# Patient Record
Sex: Female | Born: 1945 | Race: White | Hispanic: No | Marital: Married
Health system: Southern US, Community
[De-identification: ages and names within clinical notes are randomized; demographics above are authoritative.]

## PROBLEM LIST (undated history)

## (undated) DIAGNOSIS — I4891 Unspecified atrial fibrillation: Secondary | ICD-10-CM

## (undated) DIAGNOSIS — E119 Type 2 diabetes mellitus without complications: Secondary | ICD-10-CM

## (undated) DIAGNOSIS — I509 Heart failure, unspecified: Secondary | ICD-10-CM

## (undated) DIAGNOSIS — Z950 Presence of cardiac pacemaker: Secondary | ICD-10-CM

---

## 2018-06-17 ENCOUNTER — Encounter (HOSPITAL_COMMUNITY): Payer: Self-pay | Admitting: Emergency Medicine

## 2018-06-17 ENCOUNTER — Emergency Department (HOSPITAL_COMMUNITY): Payer: Medicare Other

## 2018-06-17 ENCOUNTER — Emergency Department (HOSPITAL_COMMUNITY)
Admission: EM | Admit: 2018-06-17 | Discharge: 2018-06-17 | Disposition: A | Discharge status: Home / Self Care | Payer: Medicare Other | Attending: Emergency Medicine | Admitting: Emergency Medicine

## 2018-06-17 DIAGNOSIS — Y9389 Activity, other specified: Secondary | ICD-10-CM | POA: Diagnosis not present

## 2018-06-17 DIAGNOSIS — S51012A Laceration without foreign body of left elbow, initial encounter: Secondary | ICD-10-CM | POA: Diagnosis not present

## 2018-06-17 DIAGNOSIS — Z950 Presence of cardiac pacemaker: Secondary | ICD-10-CM | POA: Insufficient documentation

## 2018-06-17 DIAGNOSIS — E119 Type 2 diabetes mellitus without complications: Secondary | ICD-10-CM | POA: Diagnosis not present

## 2018-06-17 DIAGNOSIS — W0110XA Fall on same level from slipping, tripping and stumbling with subsequent striking against unspecified object, initial encounter: Secondary | ICD-10-CM | POA: Insufficient documentation

## 2018-06-17 DIAGNOSIS — Y998 Other external cause status: Secondary | ICD-10-CM | POA: Insufficient documentation

## 2018-06-17 DIAGNOSIS — R55 Syncope and collapse: Secondary | ICD-10-CM | POA: Diagnosis not present

## 2018-06-17 DIAGNOSIS — I509 Heart failure, unspecified: Secondary | ICD-10-CM | POA: Insufficient documentation

## 2018-06-17 DIAGNOSIS — Y92520 Airport as the place of occurrence of the external cause: Secondary | ICD-10-CM | POA: Insufficient documentation

## 2018-06-17 DIAGNOSIS — S59902A Unspecified injury of left elbow, initial encounter: Secondary | ICD-10-CM | POA: Diagnosis present

## 2018-06-17 HISTORY — DX: Type 2 diabetes mellitus without complications: E11.9

## 2018-06-17 HISTORY — DX: Unspecified atrial fibrillation: I48.91

## 2018-06-17 HISTORY — DX: Presence of cardiac pacemaker: Z95.0

## 2018-06-17 HISTORY — DX: Heart failure, unspecified: I50.9

## 2018-06-17 LAB — CBC WITH DIFFERENTIAL/PLATELET
Abs Immature Granulocytes: 0.02 10*3/uL (ref 0.00–0.07)
Basophils Absolute: 0 10*3/uL (ref 0.0–0.1)
Basophils Relative: 0 %
EOS ABS: 0.3 10*3/uL (ref 0.0–0.5)
EOS PCT: 6 %
HEMATOCRIT: 40.6 % (ref 36.0–46.0)
HEMOGLOBIN: 12.5 g/dL (ref 12.0–15.0)
Immature Granulocytes: 0 %
Lymphocytes Relative: 14 %
Lymphs Abs: 0.7 10*3/uL (ref 0.7–4.0)
MCH: 30.8 pg (ref 26.0–34.0)
MCHC: 30.8 g/dL (ref 30.0–36.0)
MCV: 100 fL (ref 80.0–100.0)
MONO ABS: 0.4 10*3/uL (ref 0.1–1.0)
MONOS PCT: 9 %
NRBC: 0 % (ref 0.0–0.2)
Neutro Abs: 3.7 10*3/uL (ref 1.7–7.7)
Neutrophils Relative %: 71 %
Platelets: 91 10*3/uL — ABNORMAL LOW (ref 150–400)
RBC: 4.06 MIL/uL (ref 3.87–5.11)
RDW: 15.9 % — ABNORMAL HIGH (ref 11.5–15.5)
WBC: 5.2 10*3/uL (ref 4.0–10.5)

## 2018-06-17 LAB — BASIC METABOLIC PANEL
ANION GAP: 11 (ref 5–15)
BUN: 64 mg/dL — AB (ref 8–23)
CHLORIDE: 108 mmol/L (ref 98–111)
CO2: 21 mmol/L — ABNORMAL LOW (ref 22–32)
Calcium: 9.1 mg/dL (ref 8.9–10.3)
Creatinine, Ser: 2.84 mg/dL — ABNORMAL HIGH (ref 0.44–1.00)
GFR calc Af Amer: 18 mL/min — ABNORMAL LOW (ref >60)
GFR calc non Af Amer: 16 mL/min — ABNORMAL LOW (ref >60)
GLUCOSE: 135 mg/dL — AB (ref 70–99)
POTASSIUM: 4.7 mmol/L (ref 3.5–5.1)
Sodium: 140 mmol/L (ref 135–145)

## 2018-06-17 LAB — BRAIN NATRIURETIC PEPTIDE: B NATRIURETIC PEPTIDE 5: 3246.2 pg/mL — AB (ref 0.0–100.0)

## 2018-06-17 LAB — CBG MONITORING, ED: Glucose-Capillary: 122 mg/dL — ABNORMAL HIGH (ref 70–99)

## 2018-06-17 LAB — PROTIME-INR
INR: 3.53
PROTHROMBIN TIME: 34.9 s — AB (ref 11.4–15.2)

## 2018-06-17 LAB — MAGNESIUM: Magnesium: 2.2 mg/dL (ref 1.7–2.4)

## 2018-06-17 LAB — TROPONIN I: Troponin I: <0.03 ng/mL (ref <0.03)

## 2018-06-17 NOTE — ED Notes (Signed)
Patient Alert and oriented to baseline. Stable and ambulatory to baseline. Patient verbalized understanding of the discharge instructions.  Patient belongings were taken by the patient.   

## 2018-06-17 NOTE — ED Notes (Signed)
Pacemaker interrogated. 

## 2018-06-17 NOTE — ED Provider Notes (Addendum)
[facility]   CSN: [id number] Arrival date & time: 06/17/18  1537     History   Chief Complaint Chief Complaint  Patient presents with  . Loss of Consciousness    HPI Alfonzo BeersMary Dell Gunnarson is a 72 y.o. female.  HPI Patient presented to the emergency room for evaluation of a syncopal episode.  Patient was traveling to be with her family here in [place].  She was getting off the plane she when she started to feel hot and lightheaded.  Patient felt like she might pass out.  She mentioned this to her husband who suggested she go sit down.  Patient managed to get to the seats but apparently she then had a syncopal episode.  She apparently slumped over on the seat and was helped down to the ground.  Patient was unresponsive for few minutes.  According to the EMS report bystanders CPR was even initiated.  Patient regained consciousness and now she denies any complaints whatsoever.  She is not having any trouble with any chest pain or shortness of breath.  No nausea vomiting or diarrhea.  She has not noticed any blood in her stool.  She does have a history of medical problems including atrial fibrillation and she also has an AICD. Past Medical History:  Diagnosis Date  . Atrial fibrillation (HCC)   . CHF (congestive heart failure) (HCC)   . Diabetes mellitus without complication (HCC)   . Pacemaker     There are no active problems to display for this patient.   History reviewed. No pertinent surgical history.   OB History   None      Home Medications    Prior to Admission medications   Not on File    Family History No family history on file.  Social History Social History   Tobacco Use  . Smoking status: Not on file  Substance Use Topics  . Alcohol use: Not on file  . Drug use: Not on file     Allergies   Patient has no allergy information on record.   Review of Systems Review of Systems    All other systems reviewed and are negative.    Physical Exam Updated Vital Signs BP 131/77   Pulse 69   Temp 97.8 F (36.6 C) (Oral)   Resp 15   SpO2 98%   Physical Exam  Constitutional: No distress.  HENT:  Head: Normocephalic and atraumatic.  Right Ear: External ear normal.  Left Ear: External ear normal.  Mouth/Throat: No oropharyngeal exudate.  Eyes: Conjunctivae are normal. Right eye exhibits no discharge. Left eye exhibits no discharge. No scleral icterus.  Neck: Neck supple. No tracheal deviation present.  Cardiovascular: Normal rate, regular rhythm and intact distal pulses.  Pulmonary/Chest: Effort normal and breath sounds normal. No stridor. No respiratory distress. She has no wheezes. She has no rales.  Abdominal: Soft. Bowel sounds are normal. She exhibits no distension. There is no tenderness. There is no rebound and no guarding.  Musculoskeletal: She exhibits edema. She exhibits no tenderness.  Trace bilateral; skin tear left upper extremity  Neurological: She is alert. She has normal strength. No cranial nerve deficit (no facial droop, extraocular movements intact, no slurred speech) or sensory deficit. She exhibits normal muscle tone. She displays no seizure activity. Coordination normal.  No pronator drift, normal speech, extraocular movements intact  Skin: Skin is warm and dry. No rash noted. She is not diaphoretic.  Psychiatric:  She has a normal mood and affect.  Nursing note and vitals reviewed.    ED Treatments / Results  Labs (all labs ordered are listed, but only abnormal results are displayed) Labs Reviewed  CBC WITH DIFFERENTIAL/PLATELET - Abnormal; Notable for the following components:      Result Value   RDW 15.9 (*)    Platelets 91 (*)    All other components within normal limits  BASIC METABOLIC PANEL - Abnormal; Notable for the following components:   CO2 21 (*)    Glucose, Bld 135 (*)    BUN 64 (*)    Creatinine, Ser 2.84 (*)    GFR  calc non Af Amer 16 (*)    GFR calc Af Amer 18 (*)    All other components within normal limits  PROTIME-INR - Abnormal; Notable for the following components:   Prothrombin Time 34.9 (*)    All other components within normal limits  BRAIN NATRIURETIC PEPTIDE - Abnormal; Notable for the following components:   B Natriuretic Peptide 3,246.2 (*)    All other components within normal limits  CBG MONITORING, ED - Abnormal; Notable for the following components:   Glucose-Capillary 122 (*)    All other components within normal limits  TROPONIN I  MAGNESIUM    EKG EKG Interpretation  Date/Time:  Friday June 17 2018 15:43:30 EST Ventricular Rate:  78 PR Interval:    QRS Duration: 153 QT Interval:  457 QTC Calculation: 521 R Axis:   -74 Text Interpretation:  Atrial fibrillation IVCD, consider atypical RBBB Inferior infarct, age indeterminate Probable anterior infarct, age indeterminate No old tracing to compare Confirmed by Linwood Dibbles ([id number]) on 06/17/2018 4:07:17 PM   Radiology Dg Chest 2 View  Result Date: 06/17/2018 CLINICAL DATA:  Increasing fatigue after flight. Weakness. History of atrial fibrillation and CHF. EXAM: CHEST - 2 VIEW COMPARISON:  None. FINDINGS: Cardiac silhouette is moderately enlarged. Calcified aortic arch. Status post median sternotomy for CABG. Pulmonary vasculature is normal. No pleural effusion or focal consolidation. Three lead LEFT AICD with lead tips projecting RIGHT atrium, bilateral ventricles. No pneumothorax. Osteopenia. IMPRESSION: 1. Cardiomegaly.  No acute pulmonary process. 2.  Aortic Atherosclerosis (ICD10-I70.0). Electronically Signed   By: Awilda Metro M.D.   On: 06/17/2018 17:28    Procedures .Marland KitchenLaceration Repair Date/Time: 06/17/2018 6:41 PM Performed by: Linwood Dibbles, MD Authorized by: Linwood Dibbles, MD   Consent:    Consent obtained:  Verbal   Consent given by:  Patient   Risks discussed:  Infection, need for additional repair, pain,  poor cosmetic result and poor wound healing   Alternatives discussed:  No treatment and delayed treatment Universal protocol:    Procedure explained and questions answered to patient or proxy's satisfaction: yes     Relevant documents present and verified: yes     Test results available and properly labeled: yes     Imaging studies available: yes     Required blood products, implants, devices, and special equipment available: yes     Site/side marked: yes     Immediately prior to procedure, a time out was called: yes     Patient identity confirmed:  Verbally with patient Anesthesia (see MAR for exact dosages):    Anesthesia method:  None Laceration details:    Location:  Shoulder/arm   Shoulder/arm location:  L lower arm   Length (cm):  3 Repair type:    Repair type:  Simple Treatment:    Area cleansed with:  Saline  Amount of cleaning:  Standard Skin repair:    Repair method:  Tissue adhesive Comments:     superifical skin tear tacked down with dermabond   (including critical care time)  Medications Ordered in ED Medications - No data to display   Initial Impression / Assessment and Plan / ED Course  I have reviewed the triage vital signs and the nursing notes.  Pertinent labs & imaging results that were available during my care of the patient were reviewed by me and considered in my medical decision making (see chart for details).  Clinical Course as of Jun 17 1841  Fri Jun 17, 2018  1607 Brief episode of wide complex tachycardia, approx 1.2 sec duration   [JK]  1705 BUN and CT elevated.  NO old for comparison.   [JK]  1706 INR therapeutic   [JK]  1707 Old records reviewed.  In 2018 , CR was 2.42 by care everywhere in [place]   [JK]  4696 Previous records reviewed from [facility].   [JK]  1828 Patient's AICD pacemaker was interrogated.  No acute events were noted   [JK]    Clinical Course User Index [JK] Linwood Dibbles, MD    She presented  to the emergency room for evaluation after a syncopal episode.  Patient was on a flight when she started to get hot and lightheaded walking out of the plane.  Pt feels fine now.  She has multiple medical problems including ishcemic cardiomyopathy  .  The patient pacemaker was interrogated and there were no acute events noted.  I recommended that the patient be admitted to the hospital for cardiac observation considering her history.  Patient states she feels fine and does not want to be admitted to the hospital.  I explained to her it is possible she could have had an acute cardiac event causing her syncopal episode.  Patient understands and states she will return if she has any recurrent symptoms  Final Clinical Impressions(s) / ED Diagnoses   Final diagnoses:  Syncope, unspecified syncope type  Skin tear of left elbow without complication, initial encounter    ED Discharge Orders    None       Linwood Dibbles, MD 06/17/18 Hermin[facility]    Linwood Dibbles, MD 06/17/18 1842 Laceration length addendum   Linwood Dibbles, MD 06/27/18 1654

## 2018-06-17 NOTE — ED Triage Notes (Signed)
Pt arrives via EMS with reports of increased fatigue after a flight. States she had a syncopal episode that lasted about 7-8 mins into a chair. Reports some weakness but no pain. Skin tear to left arm.

## 2019-04-27 DEATH — deceased

## 2019-12-10 IMAGING — CR DG CHEST 2V
2 series · 2 of 2 positions shown · non-contrast
Comparison: None.

CLINICAL DATA: Increasing fatigue after flight. Weakness. History
of atrial fibrillation and CHF.

EXAM:
CHEST - 2 VIEW

[chest lat]
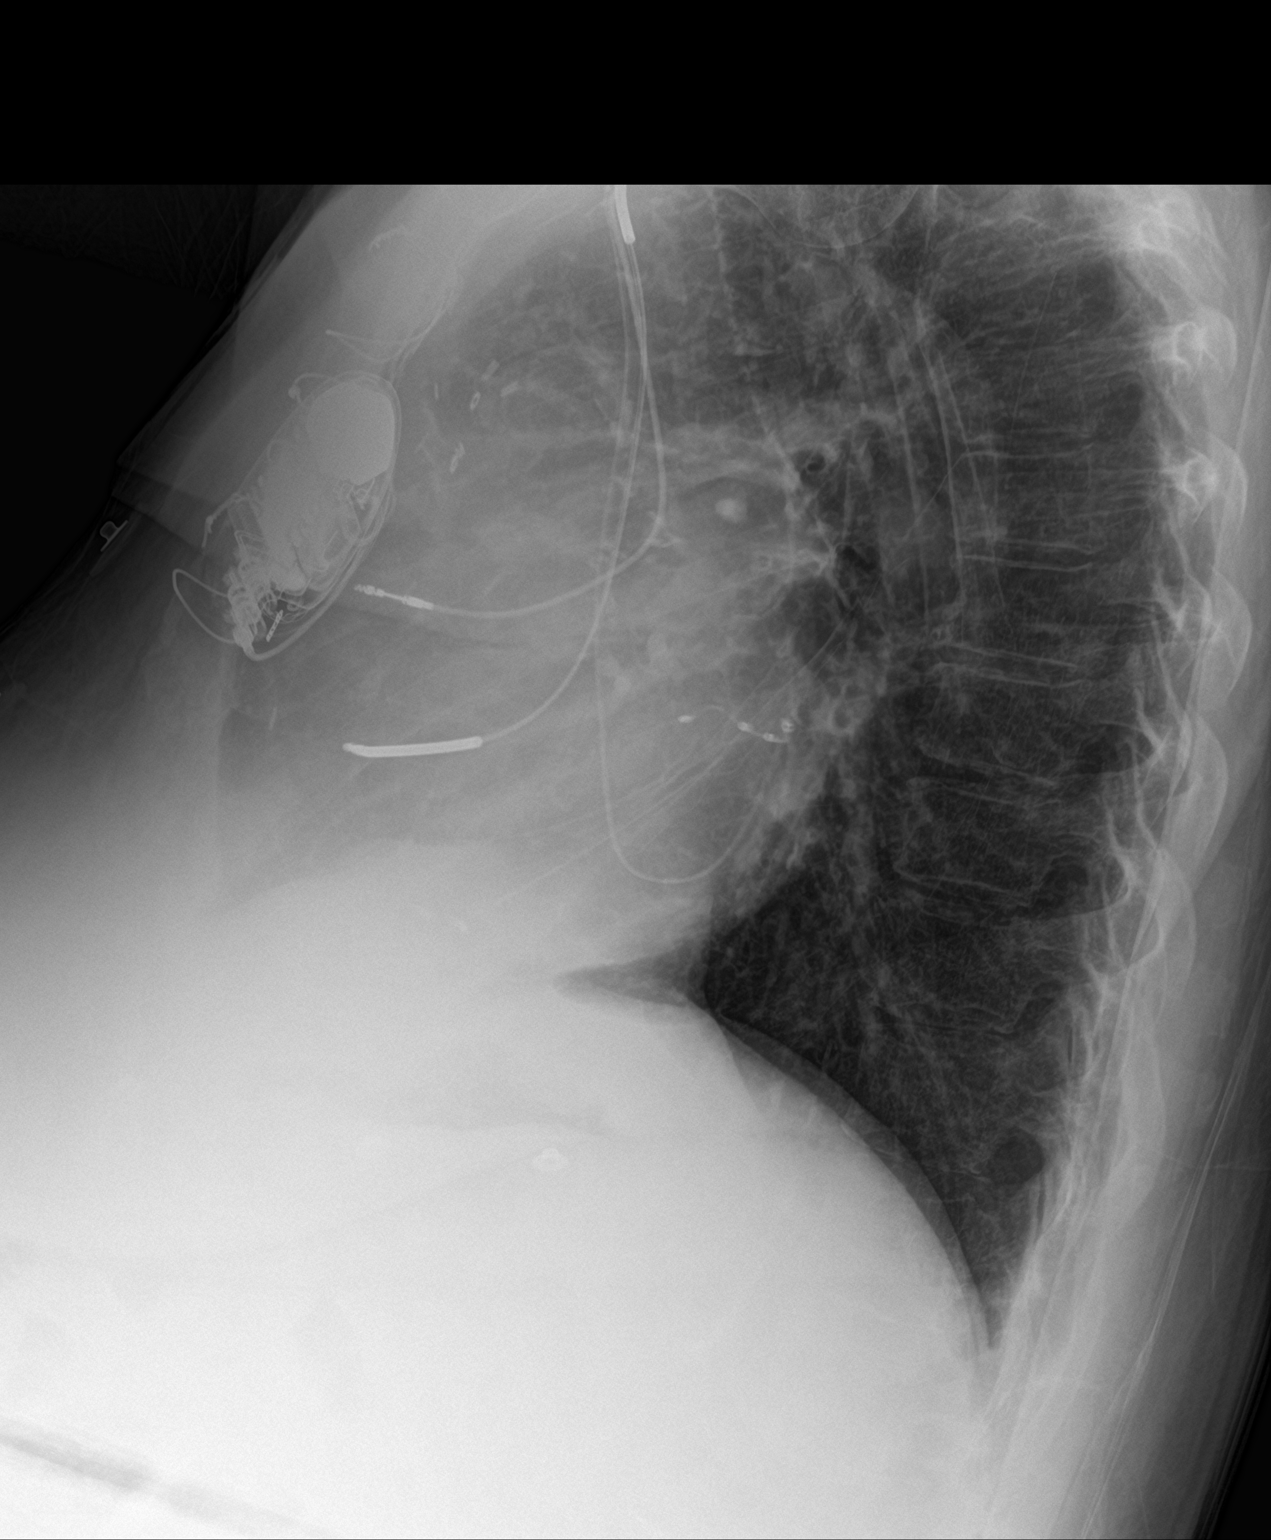

[chest ap]
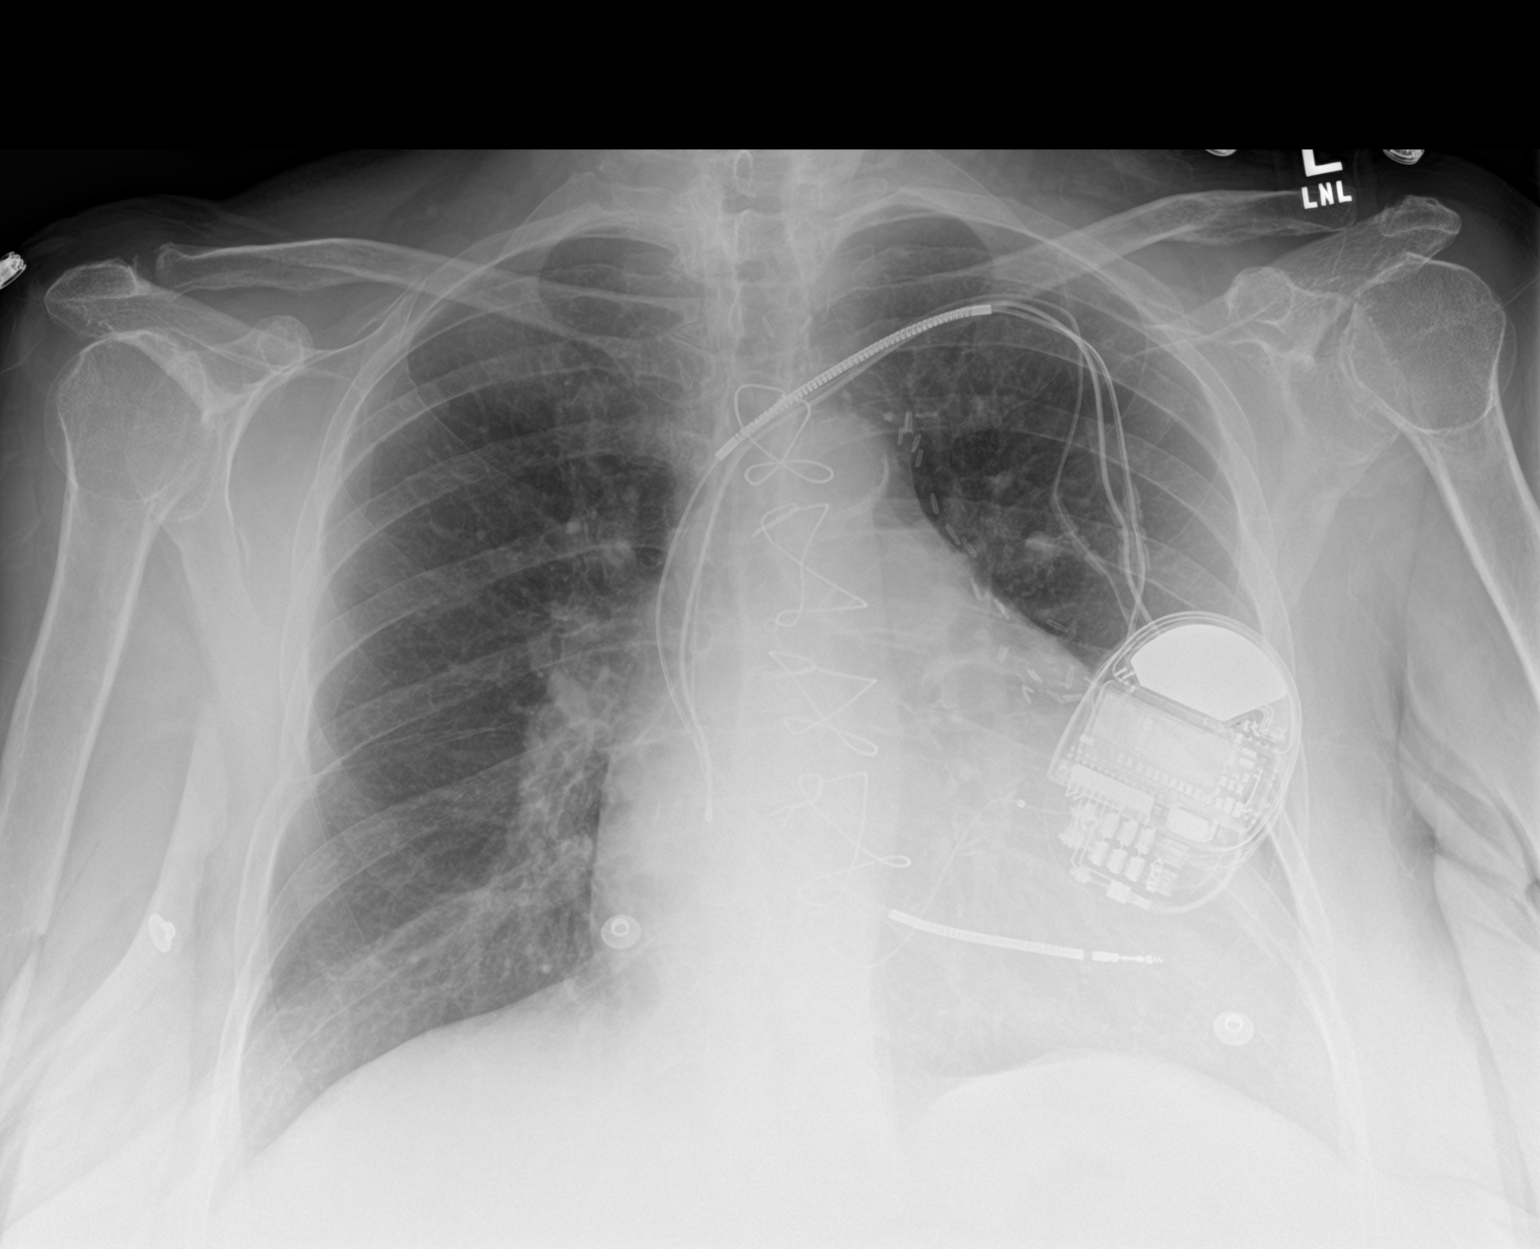

[2 of 2 positions shown; findings below may reference images not displayed]

FINDINGS: Cardiac silhouette is moderately enlarged. Calcified aortic arch.
Status post median sternotomy for CABG. Pulmonary vasculature is
normal. No pleural effusion or focal consolidation. Three lead LEFT
AICD with lead tips projecting RIGHT atrium, bilateral ventricles.
No pneumothorax. Osteopenia.
IMPRESSION: 1. Cardiomegaly.  No acute pulmonary process.
2.  Aortic Atherosclerosis ([id number]).
